# Patient Record
Sex: Female | Born: 2000 | Race: Black or African American | Hispanic: No | Marital: Single | Smoking: Never smoker
Health system: Southern US, Community
[De-identification: ages and names within clinical notes are randomized; demographics above are authoritative.]

---
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
## 2022-09-14 IMAGING — DX DG KNEE AP/LAT W/ SUNRISE*L*
3 series · 3 of 3 positions shown · non-contrast
Comparison: Radiograph 10/29/2020

CLINICAL DATA: Left knee pain following patellar dislocation.
Patellar pain for 6 months.

EXAM:
LEFT KNEE 3 VIEWS

[knee ap]
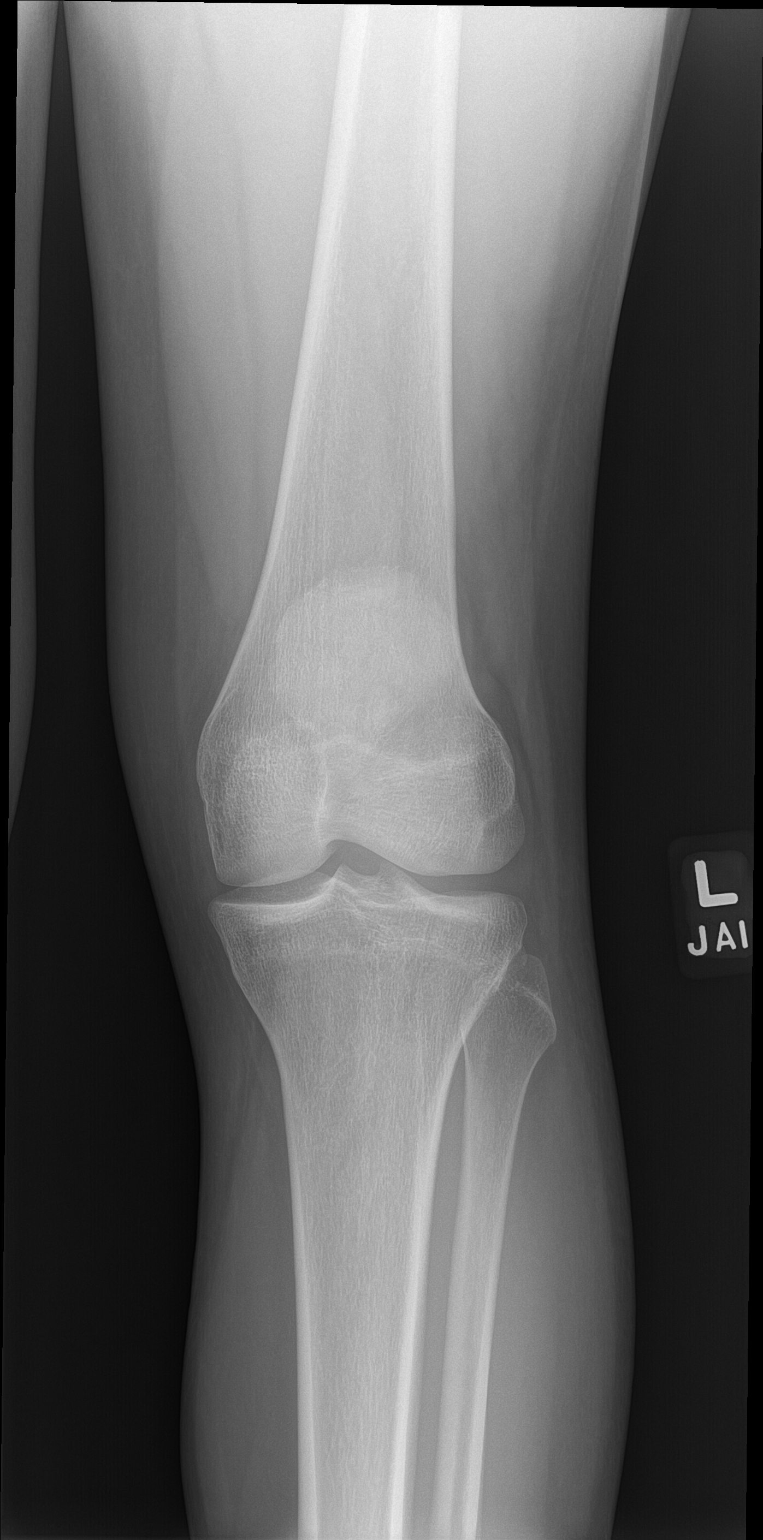

[knee lat]
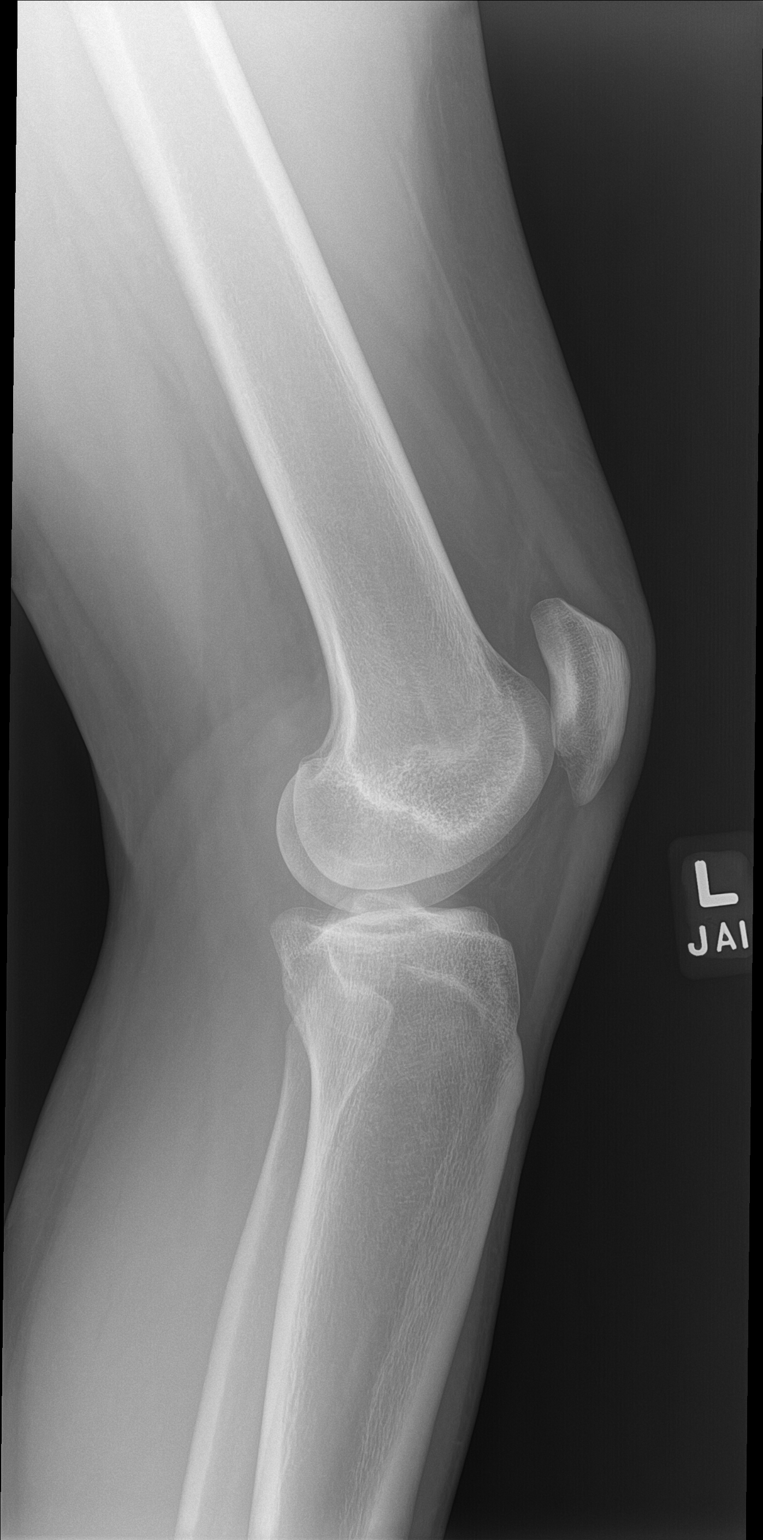

[patella]
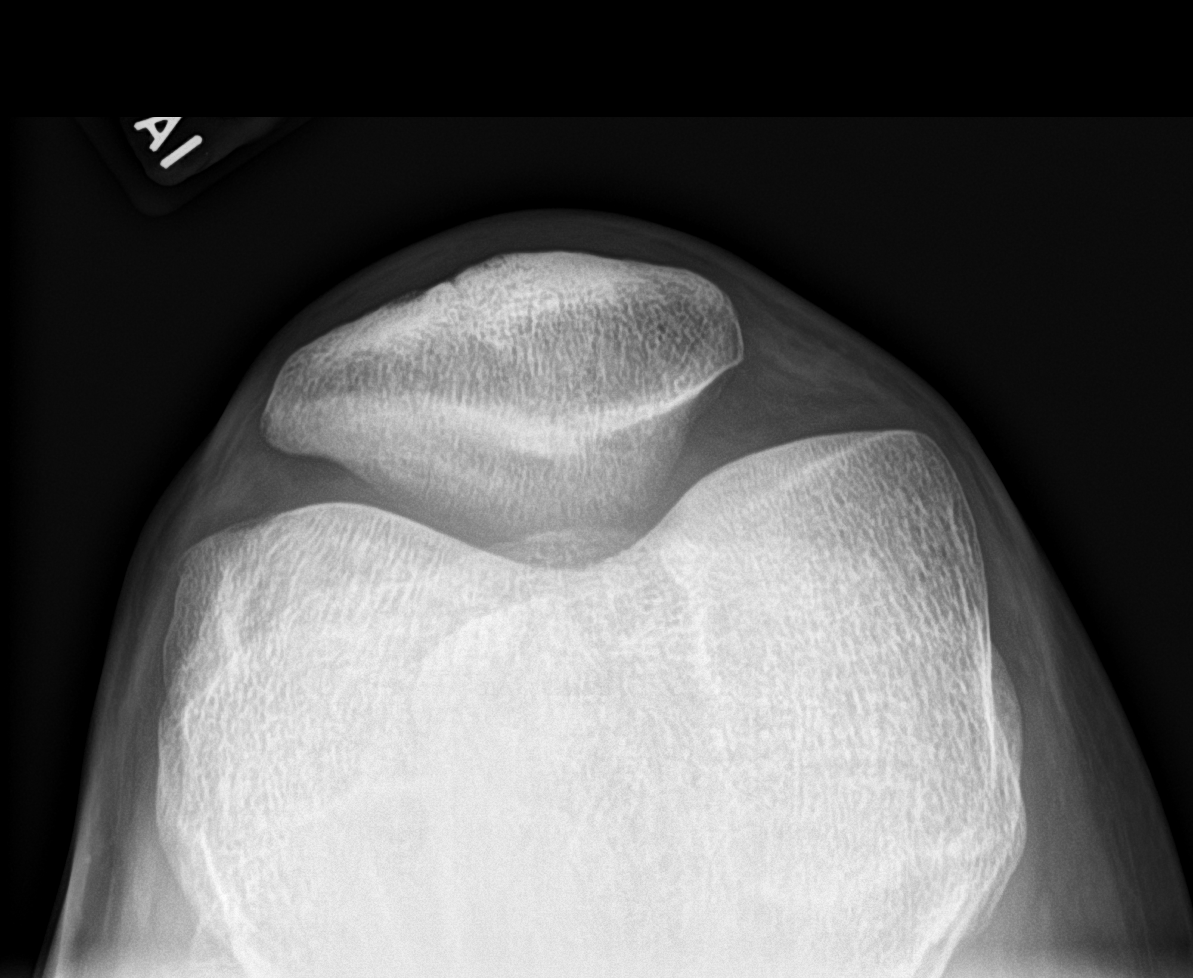

[3 of 3 positions shown; findings below may reference images not displayed]

FINDINGS: AP, lateral, patellar sunrise views obtained. Normal joint spaces
and alignment. The patella is normally situated in the trochlear
groove. No fracture or dislocation. No evidence of an avulsion
fractures. No joint effusion. No soft tissue abnormality.
IMPRESSION: Negative radiographs of the left knee. No radiographic sequela of
patellar dislocation.

## 2022-10-16 ENCOUNTER — Ambulatory Visit: Payer: 59 | Admitting: Nurse Practitioner

## 2022-12-05 ENCOUNTER — Ambulatory Visit (INDEPENDENT_AMBULATORY_CARE_PROVIDER_SITE_OTHER): Payer: 59 | Admitting: Nurse Practitioner

## 2022-12-05 VITALS — BP 122/76 | HR 91 | Temp 98.0°F | Ht 66.0 in | Wt 160.2 lb

## 2022-12-05 DIAGNOSIS — R011 Cardiac murmur, unspecified: Secondary | ICD-10-CM

## 2022-12-05 DIAGNOSIS — L309 Dermatitis, unspecified: Secondary | ICD-10-CM | POA: Diagnosis not present

## 2022-12-05 DIAGNOSIS — H6121 Impacted cerumen, right ear: Secondary | ICD-10-CM | POA: Diagnosis not present

## 2022-12-05 DIAGNOSIS — R030 Elevated blood-pressure reading, without diagnosis of hypertension: Secondary | ICD-10-CM | POA: Insufficient documentation

## 2022-12-05 MED ORDER — TRIAMCINOLONE ACETONIDE 0.025 % EX OINT: 1.0000 | TOPICAL_OINTMENT | Freq: Two times a day (BID) | CUTANEOUS | 1 refills | Status: AC

## 2022-12-05 NOTE — Assessment & Plan Note (Signed)
Incidental finding on exam today, patient reports no known history of heart murmur.  Also appears asymptomatic.  Murmur does appear a bit loud to me so I will refer to cardiology for assistance with evaluation and management.

## 2022-12-05 NOTE — Progress Notes (Signed)
New Patient Office Visit  Subjective    Patient ID: Alejandra Griffin, female    DOB: Feb 02, 2001  Age: 22 y.o. MRN: [id number]  CC:  Chief Complaint  Patient presents with   Eczema    HPI Alejandra Griffin presents to establish care Her main concern is that she has eczema, currently experiencing flareup on shin and chest.  Does report using moisturizers twice a day, avoidance of hot showers.  Also reports some discomfort to her right ear.  Outpatient Encounter Medications as of 12/05/2022  Medication Sig   triamcinolone (KENALOG) 0.025 % ointment Apply 1 Application topically 2 (two) times daily.   [DISCONTINUED] triamcinolone (KENALOG) 0.025 % ointment Apply 1 application topically 2 (two) times daily.   No facility-administered encounter medications on file as of 12/05/2022.    No past medical history on file.  No past surgical history on file.  Family History  Problem Relation Age of Onset   Hypertension Mother    Anxiety disorder Mother    Suicidality Father     Social History   Socioeconomic History   Marital status: Single    Spouse name: Not on file   Number of children: Not on file   Years of education: Not on file   Highest education level: Not on file  Occupational History   Not on file  Tobacco Use   Smoking status: Never   Smokeless tobacco: Never  Vaping Use   Vaping Use: Never used  Substance and Sexual Activity   Alcohol use: Yes   Drug use: Yes    Types: Marijuana   Sexual activity: Not on file  Other Topics Concern   Not on file  Social History Narrative   Not on file   Social Determinants of Health   Financial Resource Strain: Not on file  Food Insecurity: Not on file  Transportation Needs: Not on file  Physical Activity: Not on file  Stress: Not on file  Social Connections: Not on file  Intimate Partner Violence: Not on file    Review of Systems  Respiratory:  Negative for shortness of breath.   Cardiovascular:  Negative for chest  pain.  Neurological:  Negative for dizziness.        Objective    BP 122/76   Pulse 91   Temp 98 F (36.7 C) (Temporal)   Ht '5\' 6"'$  (1.676 m)   Wt 160 lb 4 oz (72.7 kg)   BMI 25.87 kg/m   Physical Exam Vitals reviewed.  Constitutional:      General: She is not in acute distress.    Appearance: Normal appearance.  HENT:     Head: Normocephalic and atraumatic.     Right Ear: Ear canal and external ear normal. There is impacted cerumen.     Left Ear: Tympanic membrane, ear canal and external ear normal.  Neck:     Vascular: No carotid bruit.  Cardiovascular:     Rate and Rhythm: Normal rate and regular rhythm.     Pulses: Normal pulses.     Heart sounds: Murmur heard.  Pulmonary:     Effort: Pulmonary effort is normal.     Breath sounds: Normal breath sounds.  Skin:    General: Skin is warm and dry.          Comments: Eczema   Neurological:     General: No focal deficit present.     Mental Status: She is alert and oriented to person, place, and time.  Psychiatric:  Mood and Affect: Mood normal.        Behavior: Behavior normal.        Judgment: Judgment normal.         Assessment & Plan:   Problem List Items Addressed This Visit       Nervous and Auditory   Impacted cerumen of right ear    Patient underwent lavage, reports symptoms much improved afterwards.  Reviewing tympanic membrane this is intact, no sign of ear infection noted.  No additional workup or treatment recommended at this time.        Musculoskeletal and Integument   Eczema - Primary    Prescribed triamcinolone cream that she can apply twice a day for up to 2 weeks.  Refer to specialty practice as well to assist with treatment of severe eczema.      Relevant Medications   triamcinolone (KENALOG) 0.025 % ointment   Other Relevant Orders   Ambulatory referral to Dermatology     Other   Elevated blood pressure reading    Blood pressure significantly elevated initially,  improved greatly upon resting in the room for a few minutes.  Does have a strong family history of hypertension.  Follow-up in 2 weeks for close monitoring.  Collect CBC and CMP at that time due to lab being closed today.      Relevant Orders   CBC   Comprehensive metabolic panel   Murmur    Incidental finding on exam today, patient reports no known history of heart murmur.  Also appears asymptomatic.  Murmur does appear a bit loud to me so I will refer to cardiology for assistance with evaluation and management.      Relevant Orders   Ambulatory referral to Cardiology    Return in about 2 weeks (around 12/19/2022) for F/u with Judson Roch.   Ailene Ards, NP

## 2022-12-05 NOTE — Assessment & Plan Note (Signed)
Prescribed triamcinolone cream that she can apply twice a day for up to 2 weeks.  Refer to specialty practice as well to assist with treatment of severe eczema.

## 2022-12-05 NOTE — Assessment & Plan Note (Signed)
Patient underwent lavage, reports symptoms much improved afterwards.  Reviewing tympanic membrane this is intact, no sign of ear infection noted.  No additional workup or treatment recommended at this time.

## 2022-12-05 NOTE — Assessment & Plan Note (Signed)
Blood pressure significantly elevated initially, improved greatly upon resting in the room for a few minutes.  Does have a strong family history of hypertension.  Follow-up in 2 weeks for close monitoring.  Collect CBC and CMP at that time due to lab being closed today.

## 2022-12-05 NOTE — Progress Notes (Signed)
PRE-PROCEDURE EXAM: right TM cannot be visualized due to total occlusion/impaction of the ear canal.  PROCEDURE INDICATION: remove wax to visualize ear drum & relieve discomfort  CONSENT:  Verbal     PROCEDURE NOTE:     Right  EAR:  I used warm water irrigation under direct visualization with the otoscope to free the wax bolus from the ear canal.    POST- PROCEDURE EXAM: right TMs successfully visualized and found to have no erythema     The patient tolerated the procedure well.

## 2022-12-22 NOTE — Progress Notes (Signed)
Cardiology Office Note:   Date:  12/26/2022  NAME:  Alejandra Griffin    MRN: [id number] DOB:  2001/04/25   PCP:  Ailene Ards, NP  Cardiologist:  None  Electrophysiologist:  None   Referring MD: Ailene Ards, NP   Chief Complaint  Patient presents with   New Patient (Initial Visit)   History of Present Illness:   [name] is a 22 y.o. female with a hx of eczema who is being seen today for the evaluation of murmur at the request of Ailene Ards, NP.  Recently evaluated by primary care physician.  Murmur heard.  Sent for evaluation.  He tells she is quite healthy.  Denies any significant symptoms.  Has struggled with chest discomfort in the past.  She reports she can modify based on daily consumption.  She can get short of breath with heavy activity but was actually able to exercise quite a bit.  Her EKG is normal.  She does have a faint 2 out of 6 systolic ejection murmur.  Suspect this is innocent.  Does not sound pathologic.  Her mother has a murmur.  No significant heart disease reported.  She does not smoke.  No alcohol or drug use is reported.  Overall she is quite healthy.  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  Overall seems to be quite healthy.  Past Medical History: History reviewed. No pertinent past medical history.  Past Surgical History: History reviewed. No pertinent surgical history.  Current Medications: Current Meds  Medication Sig   triamcinolone (KENALOG) 0.025 % ointment Apply 1 Application topically 2 (two) times daily.     Allergies:    Patient has no known allergies.   Social History: Social History   Socioeconomic History   Marital status: Single    Spouse name: Not on file   Number of children: Not on file   Years of education: Not on file   Highest education level: Not on file  Occupational History   Occupation: Student  Tobacco Use   Smoking status: Never   Smokeless tobacco: Never  Vaping Use   Vaping Use:  Never used  Substance and Sexual Activity   Alcohol use: Yes   Drug use: Never   Sexual activity: Not on file  Other Topics Concern   Not on file  Social History Narrative   Not on file   Social Determinants of Health   Financial Resource Strain: Not on file  Food Insecurity: Not on file  Transportation Needs: Not on file  Physical Activity: Not on file  Stress: Not on file  Social Connections: Not on file     Family History: The patient's family history includes Anxiety disorder in her mother; Hypertension in her mother; Suicidality in her father.  ROS:   All other ROS reviewed and negative. Pertinent positives noted in the HPI.     EKGs/Labs/Other Studies Reviewed:   The following studies were personally reviewed by me today:  EKG:  EKG is ordered today.  The ekg ordered today demonstrates normal sinus rhythm heart rate 76, no acute ischemic changes or evidence of infarction, and was personally reviewed by me.   Recent Labs: No results found for requested labs within last 365 days.   Recent Lipid Panel No results found for: "CHOL", "TRIG", "HDL", "CHOLHDL", "VLDL", "LDLCALC", "LDLDIRECT"  Physical Exam:   VS:  BP 124/68 (BP Location: Left Arm, Patient Position: Sitting, Cuff Size: Normal)   Pulse  76   Ht 5\' 6"  (1.676 m)   Wt 158 lb (71.7 kg)   BMI 25.50 kg/m    Wt Readings from Last 3 Encounters:  12/26/22 158 lb (71.7 kg) (86 %, Z= 1.09)*  12/05/22 160 lb 4 oz (72.7 kg) (88 %, Z= 1.16)*  09/03/21 145 lb (65.8 kg) (80 %, Z= 0.83)*   * Growth percentiles are based on CDC (Girls, 2-20 Years) data.    General: Well nourished, well developed, in no acute distress Head: Atraumatic, normal size  Eyes: PEERLA, EOMI  Neck: Supple, no JVD Endocrine: No thryomegaly Cardiac: Normal S1, S2; RRR; 1 out of 6 systolic ejection murmur Lungs: Clear to auscultation bilaterally, no wheezing, rhonchi or rales  Abd: Soft, nontender, no hepatomegaly  Ext: No edema, pulses  2+ Musculoskeletal: No deformities, BUE and BLE strength normal and equal Skin: Warm and dry, no rashes   Neuro: Alert and oriented to person, place, time, and situation, CNII-XII grossly intact, no focal deficits  Psych: Normal mood and affect   ASSESSMENT:   Alejandra Griffin is a 22 y.o. female who presents for the following: 1. Murmur     PLAN:   1. Murmur -Faint 1 out of 6 systolic ejection murmur.  Suspect this is innocent and benign.  Likely just a flow number given her small body habitus.  Will check an echocardiogram to provide reassurance.  She will see me back as needed.  Disposition: Return if symptoms worsen or fail to improve.  Medication Adjustments/Labs and Tests Ordered: Current medicines are reviewed at length with the patient today.  Concerns regarding medicines are outlined above.  Orders Placed This Encounter  Procedures   EKG 12-Lead   ECHOCARDIOGRAM COMPLETE   No orders of the defined types were placed in this encounter.   Patient Instructions  Medication Instructions:  The current medical regimen is effective;  continue present plan and medications.  *If you need a refill on your cardiac medications before your next appointment, please call your pharmacy*   Testing/Procedures: Echocardiogram - Your physician has requested that you have an echocardiogram. Echocardiography is a painless test that uses sound waves to create images of your heart. It provides your doctor with information about the size and shape of your heart and how well your heart's chambers and valves are working. This procedure takes approximately one hour. There are no restrictions for this procedure.     Follow-Up: At [facility], you and your health needs are our priority.  As part of our continuing mission to provide you with exceptional heart care, we have created designated Provider Care Teams.  These Care Teams include your primary Cardiologist (physician) and Advanced  Practice Providers (APPs -  Physician Assistants and Nurse Practitioners) who all work together to provide you with the care you need, when you need it.  We recommend signing up for the patient portal called "MyChart".  Sign up information is provided on this After Visit Summary.  MyChart is used to connect with patients for Virtual Visits (Telemedicine).  Patients are able to view lab/test results, encounter notes, upcoming appointments, etc.  Non-urgent messages can be sent to your provider as well.   To learn more about what you can do with MyChart, go to [email or web address].    Your next appointment:   As needed  Provider:   Eleonore Chiquito, MD    Signed, Addison Naegeli. Audie Box, MD, [facility]  [address], [place], [place]  [id number] [phone]  12/26/2022 2:14 PM

## 2022-12-26 ENCOUNTER — Encounter: Payer: Self-pay | Admitting: Cardiovascular Disease

## 2022-12-26 ENCOUNTER — Ambulatory Visit: Payer: 59 | Attending: Cardiovascular Disease | Admitting: Cardiovascular Disease

## 2022-12-26 ENCOUNTER — Ambulatory Visit: Payer: 59 | Admitting: Nurse Practitioner

## 2022-12-26 ENCOUNTER — Ambulatory Visit (INDEPENDENT_AMBULATORY_CARE_PROVIDER_SITE_OTHER): Payer: 59 | Admitting: Nurse Practitioner

## 2022-12-26 VITALS — BP 112/78 | HR 52 | Temp 97.6°F | Ht 66.0 in | Wt 159.1 lb

## 2022-12-26 VITALS — BP 124/68 | HR 76 | Ht 66.0 in | Wt 158.0 lb

## 2022-12-26 DIAGNOSIS — R011 Cardiac murmur, unspecified: Secondary | ICD-10-CM

## 2022-12-26 DIAGNOSIS — Z0001 Encounter for general adult medical examination with abnormal findings: Secondary | ICD-10-CM | POA: Diagnosis not present

## 2022-12-26 DIAGNOSIS — R030 Elevated blood-pressure reading, without diagnosis of hypertension: Secondary | ICD-10-CM | POA: Diagnosis not present

## 2022-12-26 NOTE — Patient Instructions (Signed)
Medication Instructions:  The current medical regimen is effective;  continue present plan and medications.  *If you need a refill on your cardiac medications before your next appointment, please call your pharmacy*   Testing/Procedures:  Echocardiogram - Your physician has requested that you have an echocardiogram. Echocardiography is a painless test that uses sound waves to create images of your heart. It provides your doctor with information about the size and shape of your heart and how well your heart's chambers and valves are working. This procedure takes approximately one hour. There are no restrictions for this procedure.     Follow-Up: At [facility], you and your health needs are our priority.  As part of our continuing mission to provide you with exceptional heart care, we have created designated Provider Care Teams.  These Care Teams include your primary Cardiologist (physician) and Advanced Practice Providers (APPs -  Physician Assistants and Nurse Practitioners) who all work together to provide you with the care you need, when you need it.  We recommend signing up for the patient portal called "MyChart".  Sign up information is provided on this After Visit Summary.  MyChart is used to connect with patients for Virtual Visits (Telemedicine).  Patients are able to view lab/test results, encounter notes, upcoming appointments, etc.  Non-urgent messages can be sent to your provider as well.   To learn more about what you can do with MyChart, go to [email or web address].    Your next appointment:   As needed  Provider:   Shenandoah O'Neal, MD   

## 2022-12-26 NOTE — Assessment & Plan Note (Signed)
Much improved today upon rooming, no additional work up recommended today.

## 2022-12-26 NOTE — Assessment & Plan Note (Signed)
Check screening labs. Offered to screen for Hep C, STIs but patient prefers to defer this for now. Let us know if she would like to do this in the future. Encouraged healthy and safe habits. Hand out regarding this provided.

## 2022-12-26 NOTE — Assessment & Plan Note (Addendum)
Scheduled for Echo on 01/22/23,follow-up with cardiology as scheduled.

## 2022-12-26 NOTE — Progress Notes (Signed)
Established Patient Office Visit  Subjective   Patient ID: Alejandra Griffin, female    DOB: 2001/09/16  Age: 22 y.o. MRN: [id number]  Chief Complaint  Patient presents with   Medical Management of Chronic Issues    No concern and to do labs     Elevated BP: elevated last office visit here for follow-up.   Health Maintenance:  STI screen: reports this has been done previously does not want to rescreen today  Hep C screen: would prefer to defer this screening today  Tdap: 06/27/2016  Flu Vaccine: deferred per patient preference  HPV: patient reports this series has been completed  Covid Vaccine: reports she completed one dose  Has no acute concerns today.     Review of Systems  Respiratory:  Negative for shortness of breath.   Cardiovascular:  Negative for chest pain.  Gastrointestinal:  Negative for abdominal pain.      Objective:     BP 112/78   Pulse (!) 52   Temp 97.6 F (36.4 C) (Temporal)   Ht 5\' 6"  (1.676 m)   Wt 159 lb 2 oz (72.2 kg)   SpO2 99%   BMI 25.68 kg/m  BP Readings from Last 3 Encounters:  12/26/22 112/78  12/26/22 124/68  12/05/22 122/76   Wt Readings from Last 3 Encounters:  12/26/22 159 lb 2 oz (72.2 kg) (87 %, Z= 1.13)*  12/26/22 158 lb (71.7 kg) (86 %, Z= 1.09)*  12/05/22 160 lb 4 oz (72.7 kg) (88 %, Z= 1.16)*   * Growth percentiles are based on CDC (Girls, 2-20 Years) data.      Physical Exam Vitals reviewed. Exam conducted with a chaperone present.  Constitutional:      Appearance: Normal appearance.  HENT:     Head: Normocephalic and atraumatic.     Right Ear: Tympanic membrane, ear canal and external ear normal.     Left Ear: Tympanic membrane, ear canal and external ear normal.  Eyes:     General:        Right eye: No discharge.        Left eye: No discharge.     Extraocular Movements: Extraocular movements intact.     Conjunctiva/sclera: Conjunctivae normal.     Pupils: Pupils are equal, round, and reactive to light.   Neck:     Vascular: No carotid bruit.  Cardiovascular:     Rate and Rhythm: Normal rate and regular rhythm.     Pulses: Normal pulses.     Heart sounds: Murmur heard.  Pulmonary:     Effort: Pulmonary effort is normal.     Breath sounds: Normal breath sounds.  Chest:  Breasts:    Breasts are symmetrical.     Right: Normal.     Left: Normal.  Abdominal:     General: Abdomen is flat. Bowel sounds are normal. There is no distension.     Palpations: Abdomen is soft. There is no mass.     Tenderness: There is no abdominal tenderness.  Musculoskeletal:        General: No tenderness.     Cervical back: Neck supple. No muscular tenderness.     Right lower leg: No edema.     Left lower leg: No edema.  Lymphadenopathy:     Cervical: No cervical adenopathy.     Upper Body:     Right upper body: No supraclavicular adenopathy.     Left upper body: No supraclavicular adenopathy.  Skin:  General: Skin is warm and dry.  Neurological:     General: No focal deficit present.     Mental Status: She is alert and oriented to person, place, and time.     Motor: No weakness.     Gait: Gait normal.  Psychiatric:        Mood and Affect: Mood normal.        Behavior: Behavior normal.        Judgment: Judgment normal.      No results found for any visits on 12/26/22.    The ASCVD Risk score (Arnett DK, et al., 2019) failed to calculate for the following reasons:   The 2019 ASCVD risk score is only valid for ages 72 to 37    Assessment & Plan:   Problem List Items Addressed This Visit       Other   Murmur    Scheduled for Echo on 01/22/23,follow-up with cardiology as scheduled.       Encounter for general adult medical examination with abnormal findings - Primary    Check screening labs. Offered to screen for Hep C, STIs but patient prefers to defer this for now. Let us know if she would like to do this in the future. Encouraged healthy and safe habits. Hand out regarding this  provided.       RESOLVED: Elevated blood pressure reading    Much improved today upon rooming, no additional work up recommended today.        Return in about 1 year (around 12/26/2023) for CPE with Chasiti Waddington.    Ailene Ards, NP

## 2023-01-22 ENCOUNTER — Ambulatory Visit (HOSPITAL_COMMUNITY): Payer: 59 | Attending: Cardiovascular Disease

## 2023-01-22 DIAGNOSIS — R011 Cardiac murmur, unspecified: Secondary | ICD-10-CM

## 2023-01-23 LAB — ECHOCARDIOGRAM COMPLETE
Area-P 1/2: 2.66 cm2
S' Lateral: 2.3 cm

## 2023-05-31 IMAGING — US US PELVIS COMPLETE WITH TRANSVAGINAL
1 series · 14 of 25 positions shown · non-contrast
Comparison: 08/19/2015

CLINICAL DATA: Moderate right pelvic pain and bleeding for 2 weeks.



[Series 1: us pelvic complete with transvaginal · 99 acquisitions, 14 frames shown]
[im 1/99]
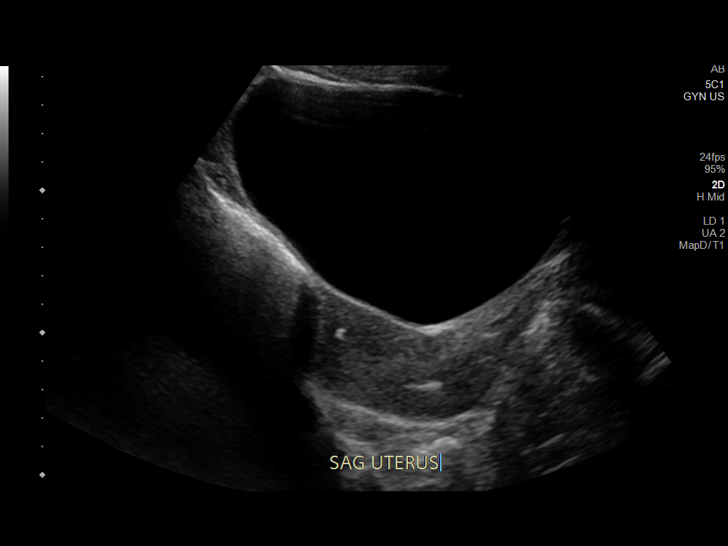
[im 9/99]
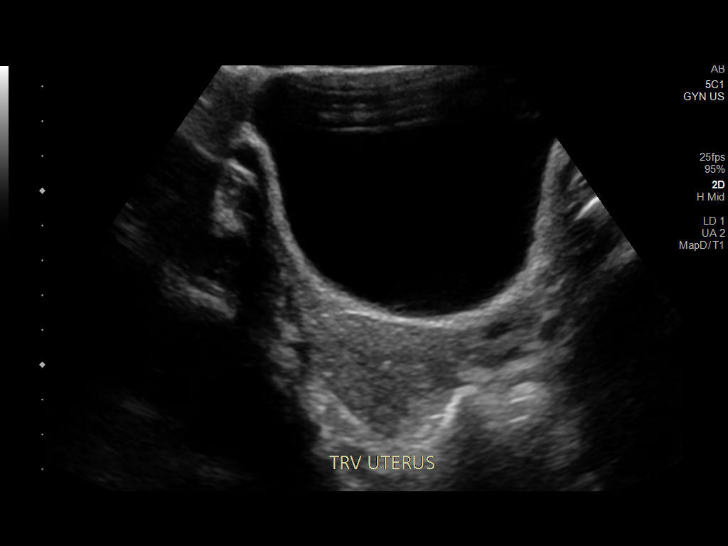
[im 17/99]
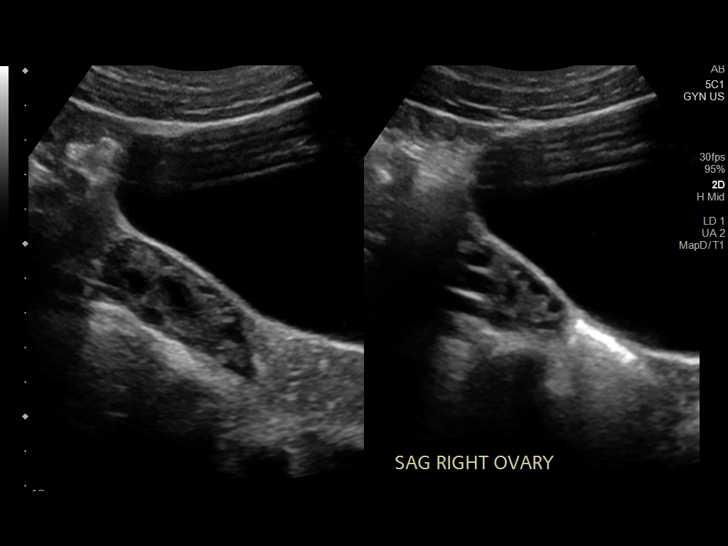
[im 25/99]
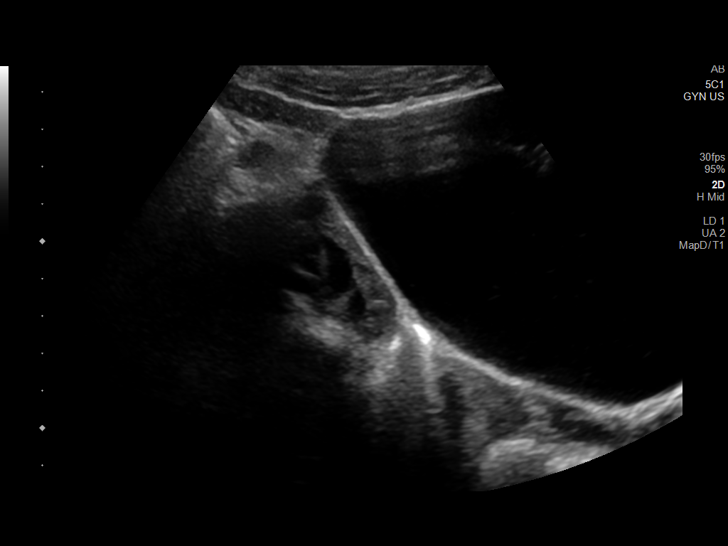
[im 33/99]
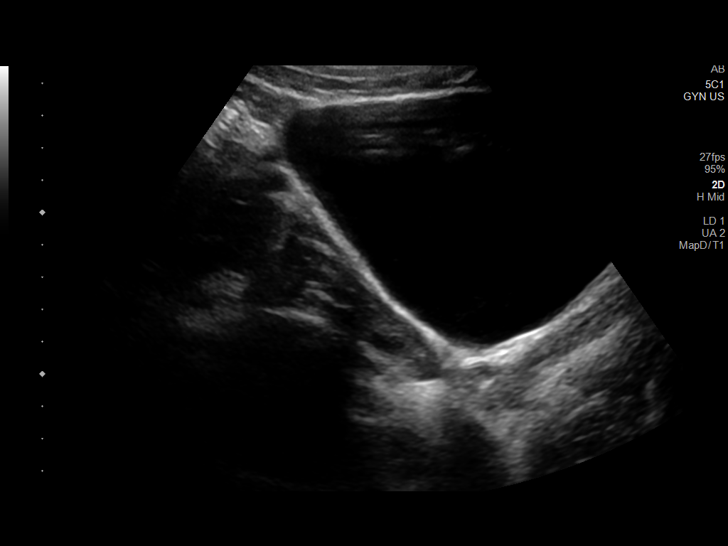
[im 37/99]
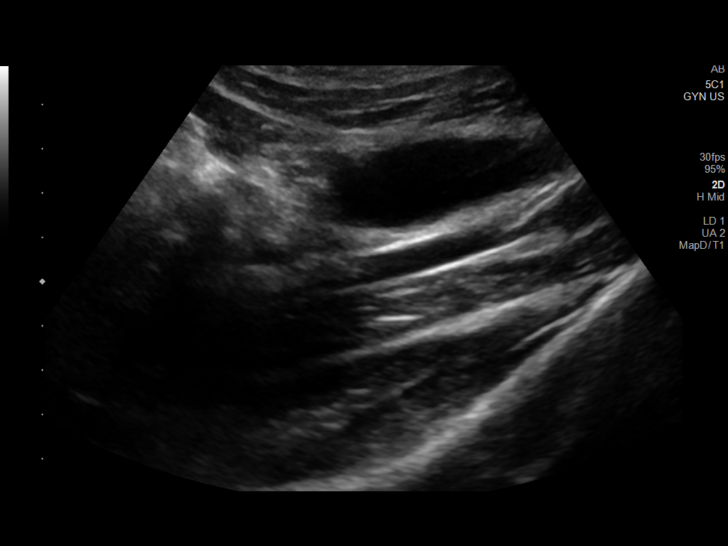
[im 45/99]
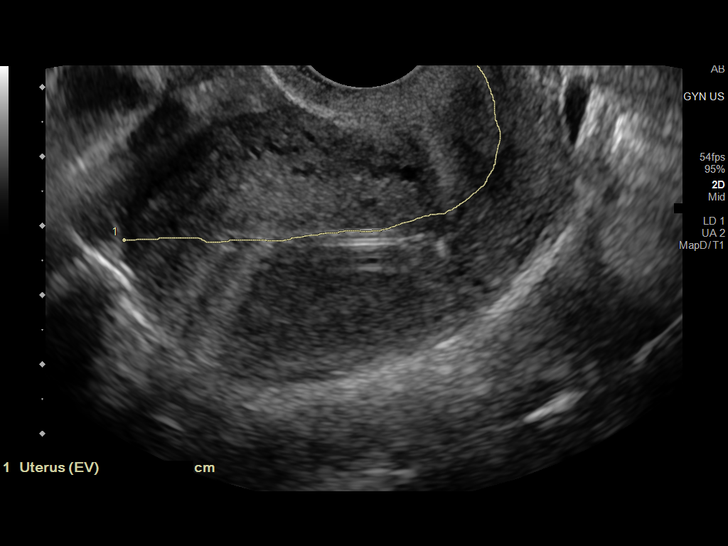
[im 54/99]
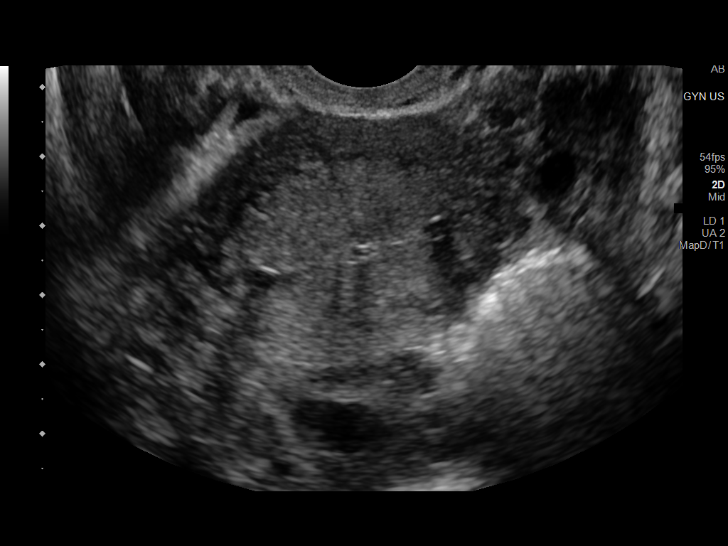
[im 62/99]
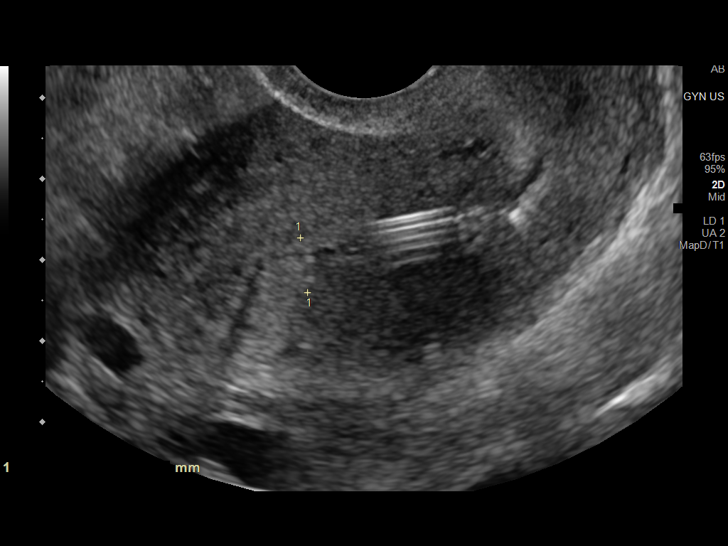
[im 66/99]
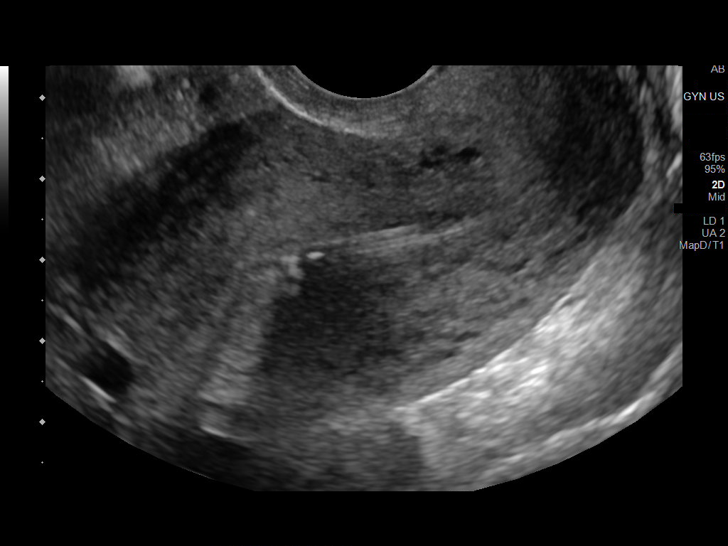
[im 74/99]
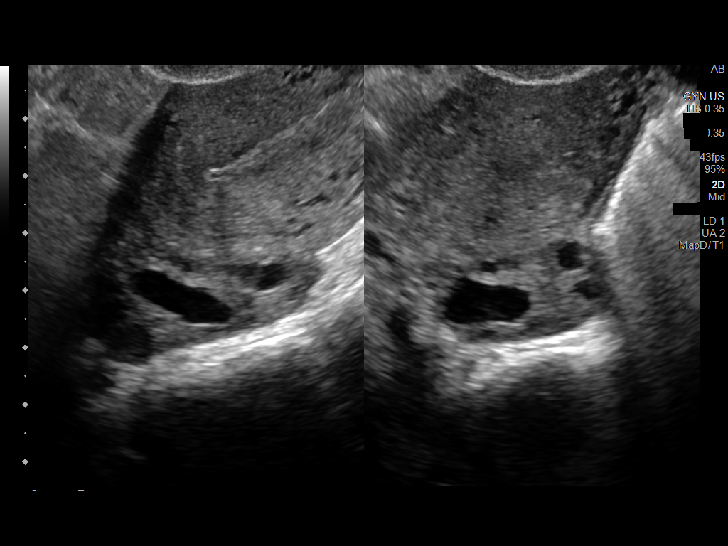
[im 82/99]
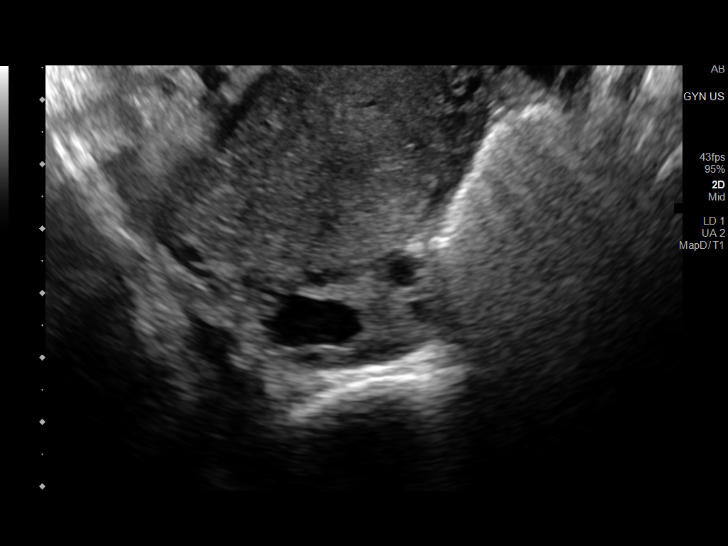
[im 90/99]
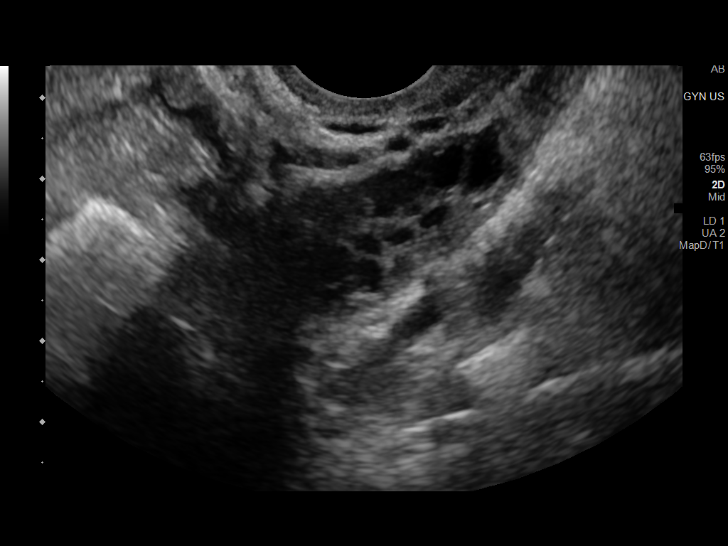
[im 99/99]
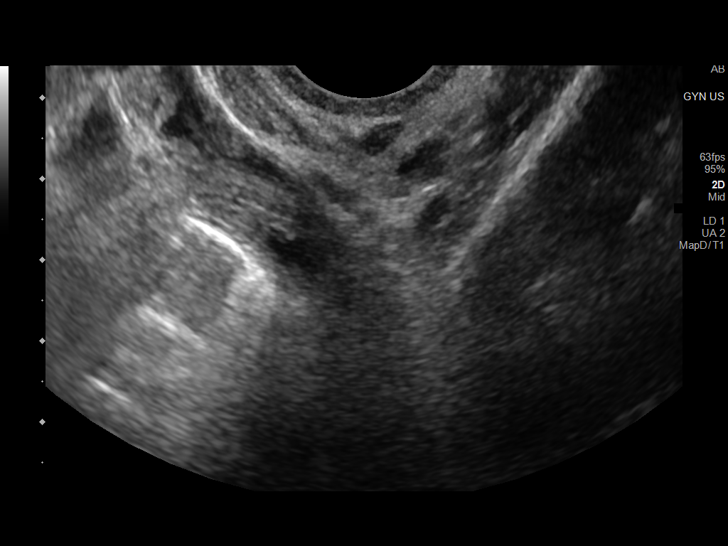

[14 of 25 positions shown; findings below may reference images not displayed]

FINDINGS: Measurements: 7.3 x 3.2 x 4.5 cm = volume: 55.1 mL. No fibroids or
other mass visualized.

Endometrium

Thickness: 6.8 mm.  An IUD is noted in the endometrium

Right ovary

Measurements: 4.5 x 2.0 x 2.4 cm = volume: 11.2 mL. Normal
appearance/no adnexal mass.

Left ovary

Measurements: 4.8 x 1.6 x 2.7 cm = volume: 11.7 mL. Normal
appearance/no adnexal mass.
IMPRESSION: IUD in the endometrium.  No acute abnormality is identified.

## 2024-12-08 ENCOUNTER — Encounter: Admitting: Nurse Practitioner
